# Patient Record
Sex: Female | Born: 1935 | Race: White | Hispanic: No
Health system: Southern US, Community
[De-identification: ages and names within clinical notes are randomized; demographics above are authoritative.]

---

## 2004-05-14 ENCOUNTER — Other Ambulatory Visit: Payer: Self-pay

## 2004-05-15 ENCOUNTER — Other Ambulatory Visit: Payer: Self-pay

## 2004-05-16 ENCOUNTER — Other Ambulatory Visit: Payer: Self-pay

## 2004-08-28 ENCOUNTER — Other Ambulatory Visit: Payer: Self-pay

## 2004-09-12 ENCOUNTER — Ambulatory Visit: Payer: Self-pay | Admitting: Pain Medicine

## 2004-09-21 ENCOUNTER — Ambulatory Visit: Payer: Self-pay | Admitting: Pain Medicine

## 2004-10-10 ENCOUNTER — Ambulatory Visit: Payer: Self-pay | Admitting: Pain Medicine

## 2004-10-20 ENCOUNTER — Ambulatory Visit: Payer: Self-pay | Admitting: Pain Medicine

## 2004-10-26 ENCOUNTER — Ambulatory Visit: Payer: Self-pay | Admitting: Pain Medicine

## 2004-12-01 ENCOUNTER — Ambulatory Visit: Payer: Self-pay | Admitting: Pain Medicine

## 2004-12-19 ENCOUNTER — Ambulatory Visit: Payer: Self-pay | Admitting: Pain Medicine

## 2005-01-09 ENCOUNTER — Encounter: Payer: Self-pay | Admitting: Pain Medicine

## 2005-01-16 ENCOUNTER — Encounter: Payer: Self-pay | Admitting: Pain Medicine

## 2005-01-19 ENCOUNTER — Ambulatory Visit: Payer: Self-pay | Admitting: Pain Medicine

## 2005-01-23 ENCOUNTER — Ambulatory Visit: Payer: Self-pay | Admitting: Pain Medicine

## 2005-01-25 ENCOUNTER — Ambulatory Visit: Payer: Self-pay | Admitting: Pain Medicine

## 2005-02-21 ENCOUNTER — Ambulatory Visit: Payer: Self-pay | Admitting: Pain Medicine

## 2005-03-01 ENCOUNTER — Ambulatory Visit: Payer: Self-pay | Admitting: Pain Medicine

## 2005-04-04 ENCOUNTER — Ambulatory Visit: Payer: Self-pay | Admitting: Pain Medicine

## 2005-04-10 ENCOUNTER — Ambulatory Visit: Payer: Self-pay | Admitting: Pain Medicine

## 2005-05-23 ENCOUNTER — Ambulatory Visit: Payer: Self-pay | Admitting: Pain Medicine

## 2005-06-22 ENCOUNTER — Ambulatory Visit: Payer: Self-pay | Admitting: Pain Medicine

## 2005-07-12 ENCOUNTER — Ambulatory Visit: Payer: Self-pay | Admitting: Pain Medicine

## 2005-09-26 ENCOUNTER — Ambulatory Visit: Payer: Self-pay | Admitting: Pain Medicine

## 2005-10-02 ENCOUNTER — Ambulatory Visit: Payer: Self-pay | Admitting: Pain Medicine

## 2005-11-09 ENCOUNTER — Ambulatory Visit: Payer: Self-pay | Admitting: Pain Medicine

## 2005-11-20 ENCOUNTER — Ambulatory Visit: Payer: Self-pay | Admitting: Pain Medicine

## 2007-02-27 ENCOUNTER — Ambulatory Visit: Payer: Self-pay | Admitting: Ophthalmology

## 2007-03-06 ENCOUNTER — Ambulatory Visit: Payer: Self-pay | Admitting: Ophthalmology

## 2007-04-11 ENCOUNTER — Ambulatory Visit: Payer: Self-pay | Admitting: Ophthalmology

## 2007-04-17 ENCOUNTER — Ambulatory Visit: Payer: Self-pay | Admitting: Ophthalmology

## 2007-10-22 ENCOUNTER — Other Ambulatory Visit: Payer: Self-pay

## 2007-10-23 ENCOUNTER — Observation Stay: Payer: Self-pay | Admitting: Internal Medicine

## 2007-11-13 ENCOUNTER — Emergency Department: Payer: Self-pay | Admitting: Internal Medicine

## 2007-11-14 ENCOUNTER — Observation Stay: Payer: Self-pay | Admitting: Internal Medicine

## 2007-11-14 ENCOUNTER — Other Ambulatory Visit: Payer: Self-pay

## 2008-04-06 ENCOUNTER — Ambulatory Visit: Payer: Self-pay | Admitting: Unknown Physician Specialty

## 2008-04-21 ENCOUNTER — Ambulatory Visit: Payer: Self-pay | Admitting: Internal Medicine

## 2010-12-09 ENCOUNTER — Inpatient Hospital Stay: Payer: Self-pay | Admitting: Internal Medicine

## 2011-03-15 ENCOUNTER — Ambulatory Visit: Payer: Self-pay | Admitting: Urology

## 2011-08-16 ENCOUNTER — Inpatient Hospital Stay: Payer: Self-pay | Admitting: Internal Medicine

## 2011-08-31 ENCOUNTER — Emergency Department: Payer: Self-pay | Admitting: *Deleted

## 2011-09-01 ENCOUNTER — Ambulatory Visit: Payer: Self-pay | Admitting: Unknown Physician Specialty

## 2011-10-30 ENCOUNTER — Ambulatory Visit: Payer: Self-pay | Admitting: Pain Medicine

## 2011-11-03 ENCOUNTER — Ambulatory Visit: Payer: Self-pay | Admitting: Pain Medicine

## 2011-11-06 ENCOUNTER — Ambulatory Visit: Payer: Self-pay | Admitting: Pain Medicine

## 2011-11-22 ENCOUNTER — Ambulatory Visit: Payer: Self-pay | Admitting: Pain Medicine

## 2012-11-16 ENCOUNTER — Emergency Department: Payer: Self-pay | Admitting: Unknown Physician Specialty

## 2012-11-16 LAB — URINALYSIS, COMPLETE
Bilirubin,UR: NEGATIVE
Glucose,UR: NEGATIVE mg/dL (ref 0–75)
Nitrite: NEGATIVE
Protein: 30
RBC,UR: 24 /HPF (ref 0–5)
Specific Gravity: 1.015 (ref 1.003–1.030)
Squamous Epithelial: 3
Transitional Epi: 1
WBC UR: 2201 /HPF (ref 0–5)

## 2012-11-16 LAB — COMPREHENSIVE METABOLIC PANEL
Albumin: 3.8 g/dL (ref 3.4–5.0)
BUN: 16 mg/dL (ref 7–18)
Calcium, Total: 8.7 mg/dL (ref 8.5–10.1)
Chloride: 108 mmol/L — ABNORMAL HIGH (ref 98–107)
Co2: 23 mmol/L (ref 21–32)
Creatinine: 0.77 mg/dL (ref 0.60–1.30)
EGFR (African American): >60
EGFR (Non-African Amer.): >60
Osmolality: 280 (ref 275–301)
Potassium: 4.1 mmol/L (ref 3.5–5.1)
SGOT(AST): 22 U/L (ref 15–37)
SGPT (ALT): 21 U/L (ref 12–78)
Sodium: 140 mmol/L (ref 136–145)

## 2012-11-16 LAB — CBC
HGB: 11.8 g/dL — ABNORMAL LOW (ref 12.0–16.0)
Platelet: 252 10*3/uL (ref 150–440)
RBC: 3.79 10*6/uL — ABNORMAL LOW (ref 3.80–5.20)
WBC: 9.3 10*3/uL (ref 3.6–11.0)

## 2012-11-17 LAB — URINE CULTURE

## 2012-11-18 ENCOUNTER — Emergency Department: Payer: Self-pay | Admitting: Emergency Medicine

## 2012-11-19 ENCOUNTER — Emergency Department: Payer: Self-pay | Admitting: Emergency Medicine

## 2012-11-19 LAB — BASIC METABOLIC PANEL
Anion Gap: 7 (ref 7–16)
BUN: 16 mg/dL (ref 7–18)
Calcium, Total: 9 mg/dL (ref 8.5–10.1)
EGFR (African American): >60
EGFR (Non-African Amer.): 59 — ABNORMAL LOW
Glucose: 94 mg/dL (ref 65–99)
Osmolality: 284 (ref 275–301)
Sodium: 142 mmol/L (ref 136–145)

## 2012-11-19 LAB — CBC
HGB: 12 g/dL (ref 12.0–16.0)
MCH: 32 pg (ref 26.0–34.0)
MCHC: 32.8 g/dL (ref 32.0–36.0)
MCV: 98 fL (ref 80–100)
Platelet: 220 10*3/uL (ref 150–440)
RDW: 14.4 % (ref 11.5–14.5)
WBC: 15.8 10*3/uL — ABNORMAL HIGH (ref 3.6–11.0)

## 2012-11-19 LAB — URINALYSIS, COMPLETE
Bilirubin,UR: NEGATIVE
Ketone: NEGATIVE
Nitrite: NEGATIVE
RBC,UR: 4 /HPF (ref 0–5)
Squamous Epithelial: 1
Transitional Epi: <1
WBC UR: 158 /HPF (ref 0–5)

## 2012-11-20 LAB — URINE CULTURE

## 2013-04-27 ENCOUNTER — Emergency Department: Payer: Self-pay | Admitting: Emergency Medicine

## 2013-04-27 LAB — CBC
HCT: 36.5 % (ref 35.0–47.0)
HGB: 12 g/dL (ref 12.0–16.0)
MCH: 31.7 pg (ref 26.0–34.0)
MCHC: 33 g/dL (ref 32.0–36.0)
MCV: 96 fL (ref 80–100)
Platelet: 183 10*3/uL (ref 150–440)
RBC: 3.8 10*6/uL (ref 3.80–5.20)
RDW: 14.3 % (ref 11.5–14.5)
WBC: 9.1 10*3/uL (ref 3.6–11.0)

## 2013-04-27 LAB — COMPREHENSIVE METABOLIC PANEL
Anion Gap: 7 (ref 7–16)
Chloride: 110 mmol/L — ABNORMAL HIGH (ref 98–107)
Co2: 25 mmol/L (ref 21–32)
EGFR (African American): >60
Glucose: 105 mg/dL — ABNORMAL HIGH (ref 65–99)
Osmolality: 285 (ref 275–301)
Potassium: 3.7 mmol/L (ref 3.5–5.1)
SGOT(AST): 25 U/L (ref 15–37)

## 2013-04-27 LAB — TROPONIN I: Troponin-I: <0.02 ng/mL

## 2013-09-15 LAB — BASIC METABOLIC PANEL
Anion Gap: 3 — ABNORMAL LOW (ref 7–16)
BUN: 19 mg/dL — ABNORMAL HIGH (ref 7–18)
Chloride: 109 mmol/L — ABNORMAL HIGH (ref 98–107)
Creatinine: 1.1 mg/dL (ref 0.60–1.30)
EGFR (Non-African Amer.): 43 — ABNORMAL LOW
Glucose: 101 mg/dL — ABNORMAL HIGH (ref 65–99)
Osmolality: 286 (ref 275–301)
Potassium: 4.2 mmol/L (ref 3.5–5.1)
Sodium: 142 mmol/L (ref 136–145)

## 2013-09-15 LAB — CBC
HCT: 38.6 % (ref 35.0–47.0)
HGB: 12.8 g/dL (ref 12.0–16.0)
MCH: 31.9 pg (ref 26.0–34.0)
MCHC: 33 g/dL (ref 32.0–36.0)

## 2013-09-15 LAB — URINALYSIS, COMPLETE
Bilirubin,UR: NEGATIVE
Glucose,UR: NEGATIVE mg/dL (ref 0–75)
Ketone: NEGATIVE
Ph: 5 (ref 4.5–8.0)
Protein: NEGATIVE
WBC UR: 38 /HPF (ref 0–5)

## 2013-09-15 LAB — TROPONIN I: Troponin-I: 0.06 ng/mL — ABNORMAL HIGH

## 2013-09-15 LAB — CK TOTAL AND CKMB (NOT AT ARMC): CK, Total: 108 U/L (ref 21–215)

## 2013-09-16 ENCOUNTER — Inpatient Hospital Stay: Payer: Self-pay | Admitting: Internal Medicine

## 2013-09-16 LAB — BASIC METABOLIC PANEL
Anion Gap: 5 — ABNORMAL LOW (ref 7–16)
Chloride: 111 mmol/L — ABNORMAL HIGH (ref 98–107)
Co2: 28 mmol/L (ref 21–32)
EGFR (African American): 54 — ABNORMAL LOW
EGFR (Non-African Amer.): 47 — ABNORMAL LOW
Glucose: 89 mg/dL (ref 65–99)

## 2013-09-16 LAB — TROPONIN I: Troponin-I: 0.19 ng/mL — ABNORMAL HIGH

## 2013-09-17 LAB — URINE CULTURE

## 2013-12-11 ENCOUNTER — Ambulatory Visit: Payer: Self-pay | Admitting: Internal Medicine

## 2013-12-16 LAB — COMPREHENSIVE METABOLIC PANEL
ALBUMIN: 3.4 g/dL (ref 3.4–5.0)
Alkaline Phosphatase: 82 U/L
Anion Gap: 5 — ABNORMAL LOW (ref 7–16)
BUN: 16 mg/dL (ref 7–18)
Bilirubin,Total: 0.4 mg/dL (ref 0.2–1.0)
CO2: 26 mmol/L (ref 21–32)
CREATININE: 0.85 mg/dL (ref 0.60–1.30)
Calcium, Total: 8.6 mg/dL (ref 8.5–10.1)
Chloride: 103 mmol/L (ref 98–107)
EGFR (African American): >60
EGFR (Non-African Amer.): 58 — ABNORMAL LOW
Glucose: 121 mg/dL — ABNORMAL HIGH (ref 65–99)
Osmolality: 271 (ref 275–301)
Potassium: 3.7 mmol/L (ref 3.5–5.1)
SGOT(AST): 22 U/L (ref 15–37)
SGPT (ALT): 14 U/L (ref 12–78)
Sodium: 134 mmol/L — ABNORMAL LOW (ref 136–145)
TOTAL PROTEIN: 6.9 g/dL (ref 6.4–8.2)

## 2013-12-16 LAB — CBC WITH DIFFERENTIAL/PLATELET
BASOS PCT: 0.4 %
Basophil #: 0 10*3/uL (ref 0.0–0.1)
EOS PCT: 4.2 %
Eosinophil #: 0.5 10*3/uL (ref 0.0–0.7)
HCT: 39.4 % (ref 35.0–47.0)
HGB: 12.6 g/dL (ref 12.0–16.0)
LYMPHS ABS: 5.5 10*3/uL — AB (ref 1.0–3.6)
Lymphocyte %: 50.2 %
MCH: 29.5 pg (ref 26.0–34.0)
MCHC: 32 g/dL (ref 32.0–36.0)
MCV: 92 fL (ref 80–100)
Monocyte #: 1 x10 3/mm — ABNORMAL HIGH (ref 0.2–0.9)
Monocyte %: 9.3 %
NEUTROS PCT: 35.9 %
Neutrophil #: 3.9 10*3/uL (ref 1.4–6.5)
Platelet: 245 10*3/uL (ref 150–440)
RBC: 4.27 10*6/uL (ref 3.80–5.20)
RDW: 14.2 % (ref 11.5–14.5)
WBC: 11 10*3/uL (ref 3.6–11.0)

## 2013-12-16 LAB — URINALYSIS, COMPLETE
BILIRUBIN, UR: NEGATIVE
Bacteria: NONE SEEN
Blood: NEGATIVE
GLUCOSE, UR: NEGATIVE mg/dL (ref 0–75)
Ketone: NEGATIVE
Leukocyte Esterase: NEGATIVE
Nitrite: NEGATIVE
PH: 6 (ref 4.5–8.0)
PROTEIN: NEGATIVE
Specific Gravity: 1.005 (ref 1.003–1.030)
WBC UR: <1 /HPF (ref 0–5)

## 2013-12-17 ENCOUNTER — Inpatient Hospital Stay: Payer: Self-pay | Admitting: Internal Medicine

## 2013-12-17 LAB — CBC WITH DIFFERENTIAL/PLATELET
Basophil: 1 %
Comment - H1-Com1: NORMAL
Comment - H1-Com2: NORMAL
EOS PCT: 4 %
HCT: 37.1 % (ref 35.0–47.0)
HGB: 12 g/dL (ref 12.0–16.0)
LYMPHS PCT: 59 %
MCH: 30.1 pg (ref 26.0–34.0)
MCHC: 32.5 g/dL (ref 32.0–36.0)
MCV: 93 fL (ref 80–100)
Monocytes: 6 %
Platelet: 209 10*3/uL (ref 150–440)
RBC: 4 10*6/uL (ref 3.80–5.20)
RDW: 14 % (ref 11.5–14.5)
Segmented Neutrophils: 28 %
Variant Lymphocyte - H1-Rlymph: 2 %
WBC: 8.5 10*3/uL (ref 3.6–11.0)

## 2013-12-17 LAB — BASIC METABOLIC PANEL
Anion Gap: 4 — ABNORMAL LOW (ref 7–16)
BUN: 12 mg/dL (ref 7–18)
CHLORIDE: 108 mmol/L — AB (ref 98–107)
CREATININE: 0.77 mg/dL (ref 0.60–1.30)
Calcium, Total: 8.5 mg/dL (ref 8.5–10.1)
Co2: 28 mmol/L (ref 21–32)
Glucose: 89 mg/dL (ref 65–99)
Osmolality: 279 (ref 275–301)
Potassium: 3.9 mmol/L (ref 3.5–5.1)
SODIUM: 140 mmol/L (ref 136–145)

## 2013-12-27 ENCOUNTER — Inpatient Hospital Stay: Payer: Self-pay | Admitting: Specialist

## 2013-12-27 LAB — POTASSIUM: Potassium: 4.3 mmol/L (ref 3.5–5.1)

## 2013-12-27 LAB — URINALYSIS, COMPLETE
BACTERIA: NONE SEEN
Bilirubin,UR: NEGATIVE
Blood: NEGATIVE
Glucose,UR: NEGATIVE mg/dL (ref 0–75)
Ketone: NEGATIVE
LEUKOCYTE ESTERASE: NEGATIVE
NITRITE: NEGATIVE
PH: 5 (ref 4.5–8.0)
Protein: NEGATIVE
RBC,UR: 1 /HPF (ref 0–5)
Specific Gravity: 1.017 (ref 1.003–1.030)
Squamous Epithelial: 15

## 2013-12-27 LAB — COMPREHENSIVE METABOLIC PANEL
ALK PHOS: 73 U/L
ANION GAP: 9 (ref 7–16)
Albumin: 3.3 g/dL — ABNORMAL LOW (ref 3.4–5.0)
BUN: 63 mg/dL — ABNORMAL HIGH (ref 7–18)
Bilirubin,Total: 0.5 mg/dL (ref 0.2–1.0)
CALCIUM: 8.1 mg/dL — AB (ref 8.5–10.1)
Chloride: 101 mmol/L (ref 98–107)
Co2: 20 mmol/L — ABNORMAL LOW (ref 21–32)
Creatinine: 1 mg/dL (ref 0.60–1.30)
EGFR (African American): 55 — ABNORMAL LOW
EGFR (Non-African Amer.): 48 — ABNORMAL LOW
Glucose: 86 mg/dL (ref 65–99)
OSMOLALITY: 278 (ref 275–301)
Potassium: 5.9 mmol/L — ABNORMAL HIGH (ref 3.5–5.1)
SGOT(AST): 28 U/L (ref 15–37)
SGPT (ALT): 16 U/L (ref 12–78)
Sodium: 130 mmol/L — ABNORMAL LOW (ref 136–145)
Total Protein: 6.3 g/dL — ABNORMAL LOW (ref 6.4–8.2)

## 2013-12-27 LAB — CBC WITH DIFFERENTIAL/PLATELET
BASOS PCT: 0.3 %
Basophil #: 0.1 10*3/uL (ref 0.0–0.1)
EOS ABS: 0.2 10*3/uL (ref 0.0–0.7)
EOS PCT: 0.9 %
HCT: 39.8 % (ref 35.0–47.0)
HGB: 13 g/dL (ref 12.0–16.0)
LYMPHS ABS: 9.7 10*3/uL — AB (ref 1.0–3.6)
Lymphocyte %: 51.4 %
MCH: 30.3 pg (ref 26.0–34.0)
MCHC: 32.6 g/dL (ref 32.0–36.0)
MCV: 93 fL (ref 80–100)
MONO ABS: 1.6 x10 3/mm — AB (ref 0.2–0.9)
Monocyte %: 8.4 %
NEUTROS PCT: 39 %
Neutrophil #: 7.4 10*3/uL — ABNORMAL HIGH (ref 1.4–6.5)
Platelet: 286 10*3/uL (ref 150–440)
RBC: 4.28 10*6/uL (ref 3.80–5.20)
RDW: 14.8 % — ABNORMAL HIGH (ref 11.5–14.5)
WBC: 18.9 10*3/uL — ABNORMAL HIGH (ref 3.6–11.0)

## 2013-12-27 LAB — MAGNESIUM: MAGNESIUM: 2.8 mg/dL — AB

## 2013-12-27 LAB — LIPASE, BLOOD: Lipase: 164 U/L (ref 73–393)

## 2013-12-27 LAB — TROPONIN I: Troponin-I: 0.05 ng/mL

## 2013-12-28 LAB — BASIC METABOLIC PANEL
ANION GAP: 8 (ref 7–16)
BUN: 39 mg/dL — ABNORMAL HIGH (ref 7–18)
CALCIUM: 7.8 mg/dL — AB (ref 8.5–10.1)
CHLORIDE: 112 mmol/L — AB (ref 98–107)
CREATININE: 0.91 mg/dL (ref 0.60–1.30)
Co2: 23 mmol/L (ref 21–32)
EGFR (African American): >60
GFR CALC NON AF AMER: 54 — AB
Glucose: 72 mg/dL (ref 65–99)
OSMOLALITY: 293 (ref 275–301)
Potassium: 3.8 mmol/L (ref 3.5–5.1)
Sodium: 143 mmol/L (ref 136–145)

## 2013-12-28 LAB — CBC WITH DIFFERENTIAL/PLATELET
BASOS ABS: 0 10*3/uL (ref 0.0–0.1)
BASOS PCT: 0.3 %
Eosinophil #: 0.2 10*3/uL (ref 0.0–0.7)
Eosinophil %: 1.6 %
HCT: 34.3 % — ABNORMAL LOW (ref 35.0–47.0)
HGB: 10.9 g/dL — ABNORMAL LOW (ref 12.0–16.0)
Lymphocyte #: 4.7 10*3/uL — ABNORMAL HIGH (ref 1.0–3.6)
Lymphocyte %: 48.6 %
MCH: 29.7 pg (ref 26.0–34.0)
MCHC: 31.9 g/dL — ABNORMAL LOW (ref 32.0–36.0)
MCV: 93 fL (ref 80–100)
MONO ABS: 1 x10 3/mm — AB (ref 0.2–0.9)
Monocyte %: 10.3 %
NEUTROS PCT: 39.2 %
Neutrophil #: 3.8 10*3/uL (ref 1.4–6.5)
Platelet: 211 10*3/uL (ref 150–440)
RBC: 3.67 10*6/uL — ABNORMAL LOW (ref 3.80–5.20)
RDW: 14.5 % (ref 11.5–14.5)
WBC: 9.6 10*3/uL (ref 3.6–11.0)

## 2013-12-28 LAB — MAGNESIUM: MAGNESIUM: 2.4 mg/dL

## 2013-12-28 LAB — T4, FREE: FREE THYROXINE: 1.28 ng/dL (ref 0.76–1.46)

## 2013-12-28 LAB — TSH: THYROID STIMULATING HORM: 0.41 u[IU]/mL — AB

## 2013-12-29 LAB — HEMOGLOBIN: HGB: 10.4 g/dL — AB (ref 12.0–16.0)

## 2014-01-01 LAB — BASIC METABOLIC PANEL
ANION GAP: 6 — AB (ref 7–16)
BUN: 13 mg/dL (ref 7–18)
Calcium, Total: 8.4 mg/dL — ABNORMAL LOW (ref 8.5–10.1)
Chloride: 109 mmol/L — ABNORMAL HIGH (ref 98–107)
Co2: 25 mmol/L (ref 21–32)
Creatinine: 0.9 mg/dL (ref 0.60–1.30)
EGFR (African American): >60
EGFR (Non-African Amer.): 54 — ABNORMAL LOW
Glucose: 92 mg/dL (ref 65–99)
Osmolality: 279 (ref 275–301)
Potassium: 3.7 mmol/L (ref 3.5–5.1)
Sodium: 140 mmol/L (ref 136–145)

## 2014-01-01 LAB — PLATELET COUNT: Platelet: 200 10*3/uL (ref 150–440)

## 2014-01-01 LAB — HEMOGLOBIN: HGB: 11 g/dL — ABNORMAL LOW (ref 12.0–16.0)

## 2014-01-11 ENCOUNTER — Ambulatory Visit: Payer: Self-pay | Admitting: Nurse Practitioner

## 2014-02-08 DEATH — deceased

## 2015-01-25 IMAGING — CT CT ABD-PELV W/ CM
2 of 5 series · 15 of 46 positions shown, 17 images · IV contrast (agent unspecified)
Comparison: MRI lumbar spine 11/03/2011.

CLINICAL DATA: Vomiting for 3 days.

EXAM:
CT ABDOMEN AND PELVIS WITH CONTRAST
TECHNIQUE: Multidetector CT imaging of the abdomen and pelvis was performed
using the standard protocol following bolus administration of
intravenous contrast.
CONTRAST:  100 mL [id number]

[Series 2: routine abd pel with · axial · 0.64mm/px · z∈[-412,-32]mm · 12 of 84 slices shown, 14 images]
[im 4/84  soft-tissue]
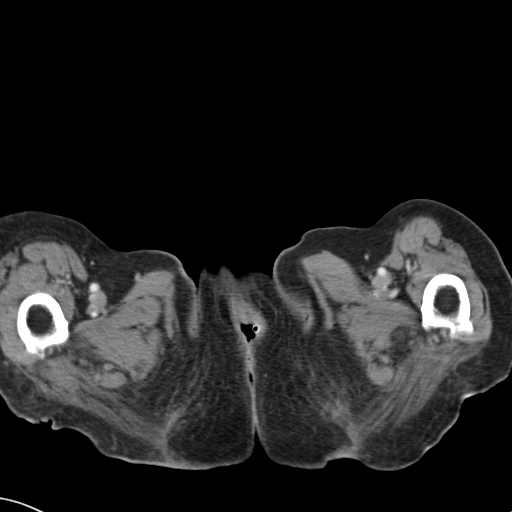
[im 4/84  bone]
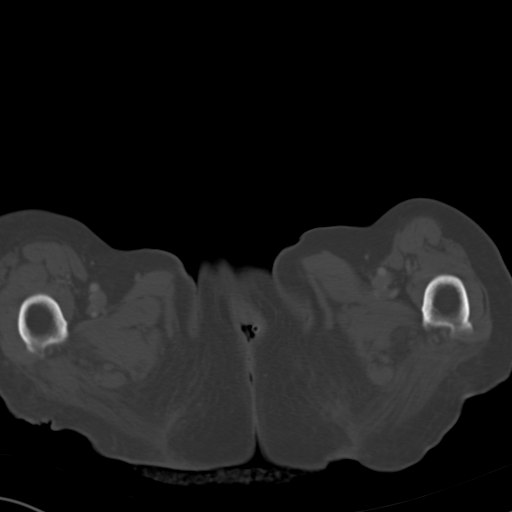
[im 12/84  soft-tissue]
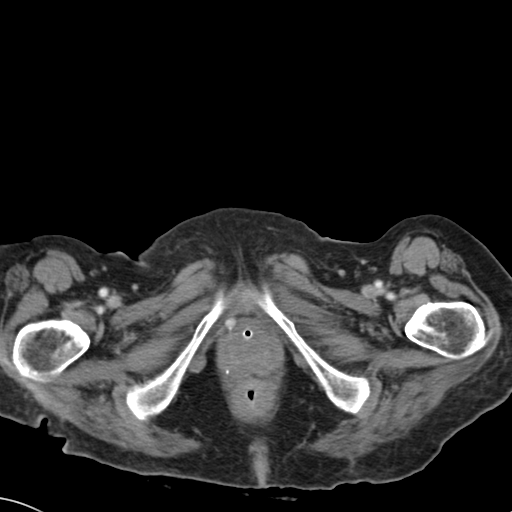
[im 20/84  soft-tissue]
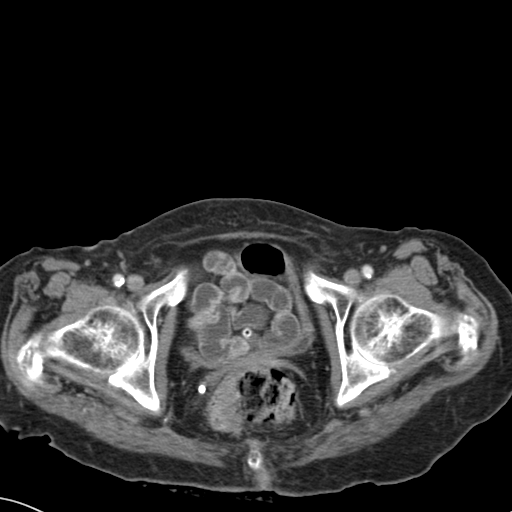
[im 24/84  soft-tissue]
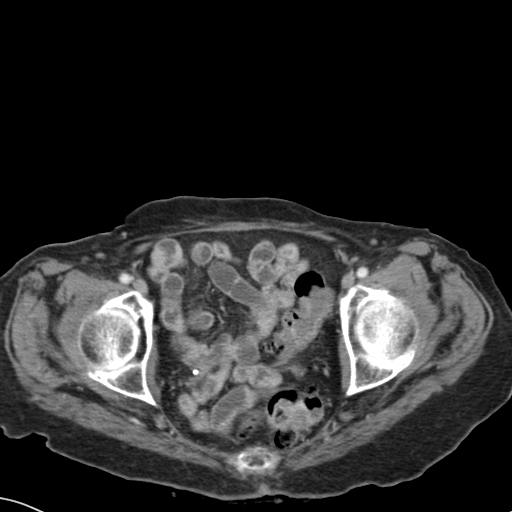
[im 32/84  soft-tissue]
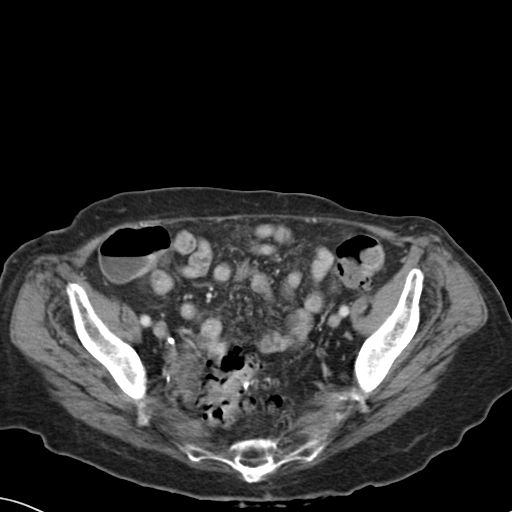
[im 40/84  soft-tissue]
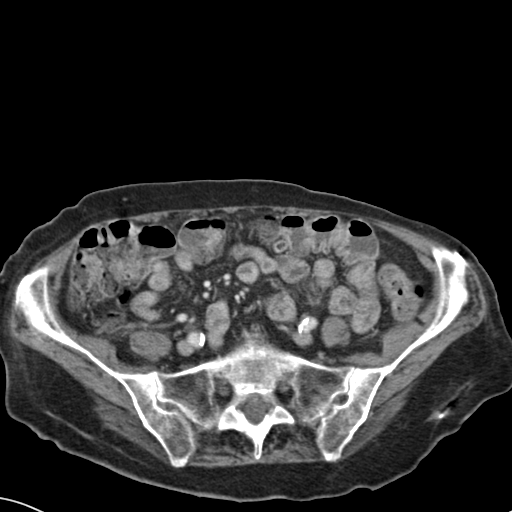
[im 44/84  soft-tissue]
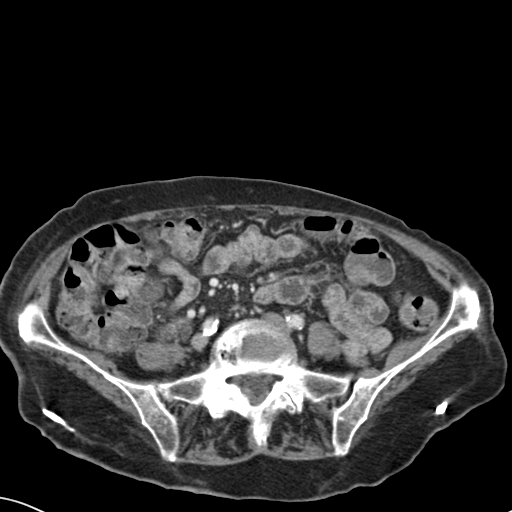
[im 52/84  soft-tissue]
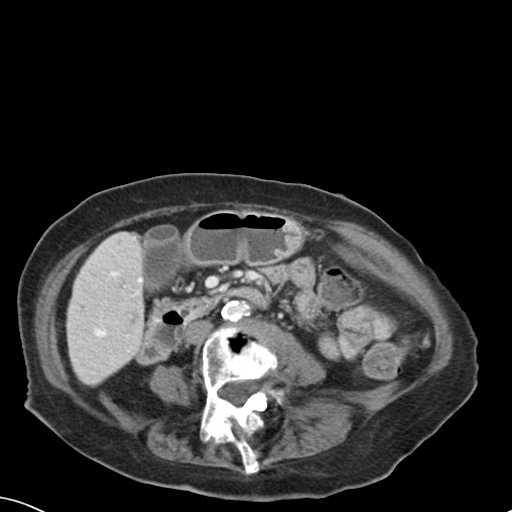
[im 60/84  soft-tissue]
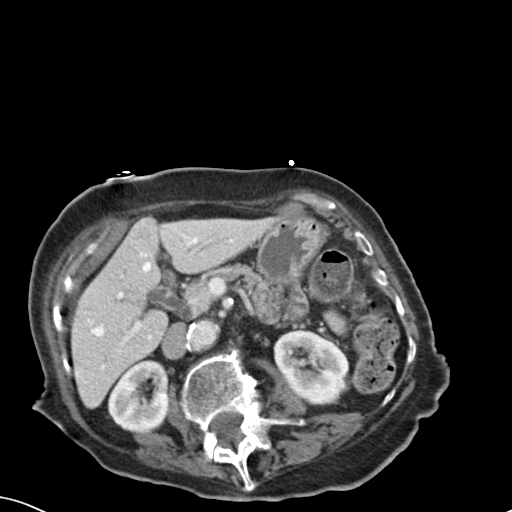
[im 60/84  bone]
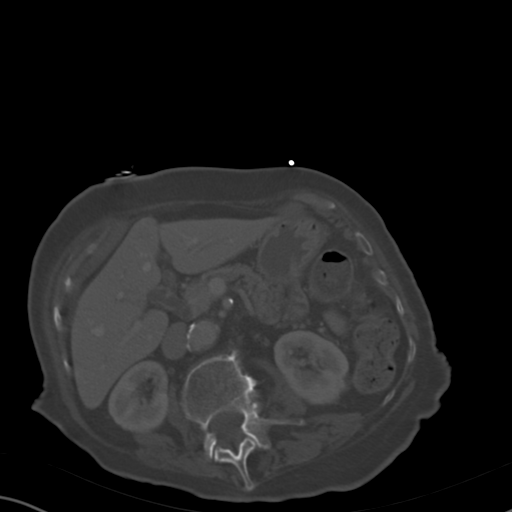
[im 64/84  soft-tissue]
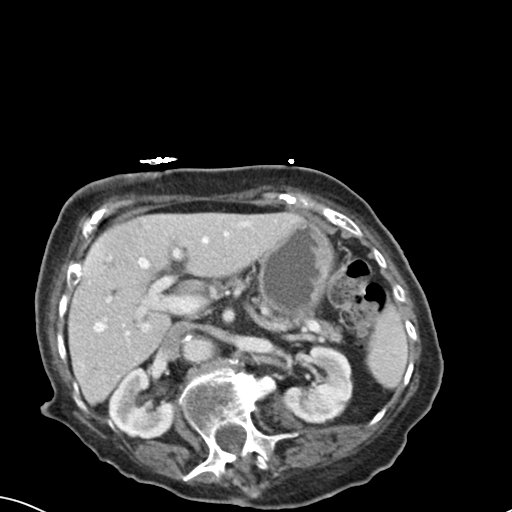
[im 72/84  soft-tissue]
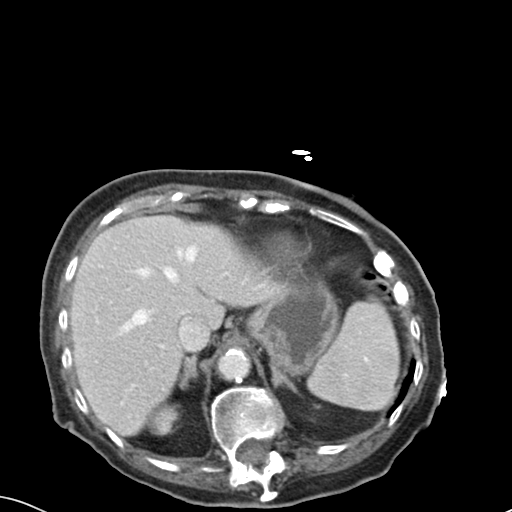
[im 80/84  soft-tissue]
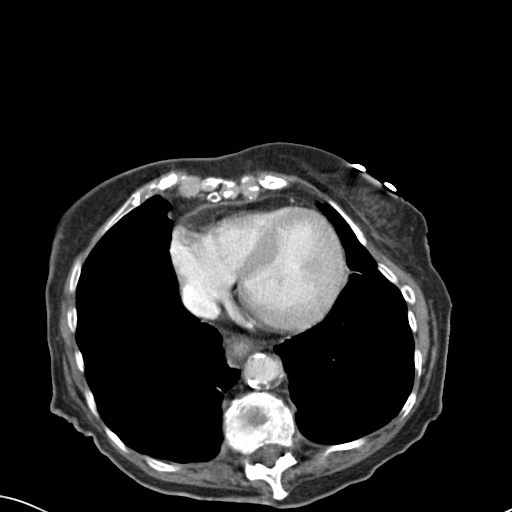

[Series 6: cor routine abd pel with · coronal · 0.62mm/px · 3 of 123 slices shown]
[im 41/123  soft-tissue]
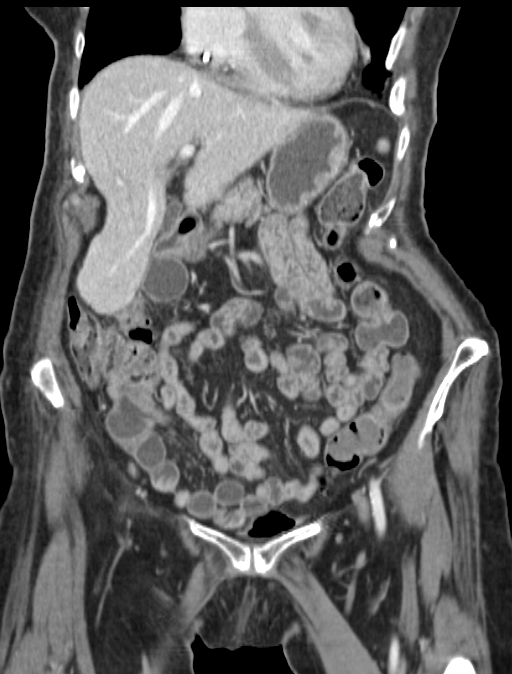
[im 55/123  soft-tissue]
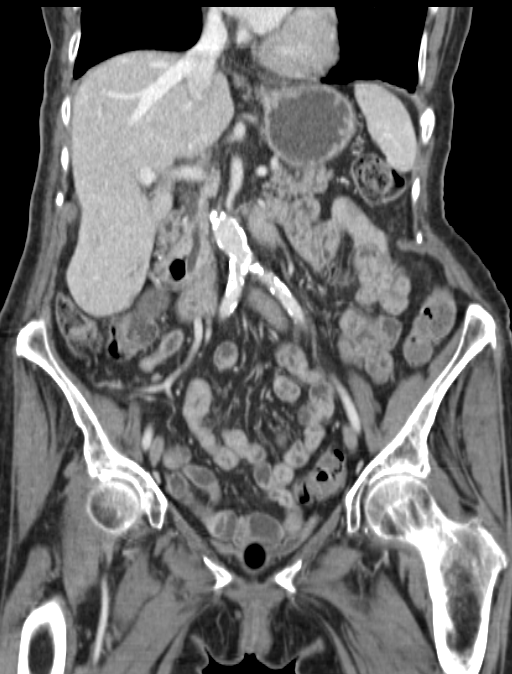
[im 68/123  soft-tissue]
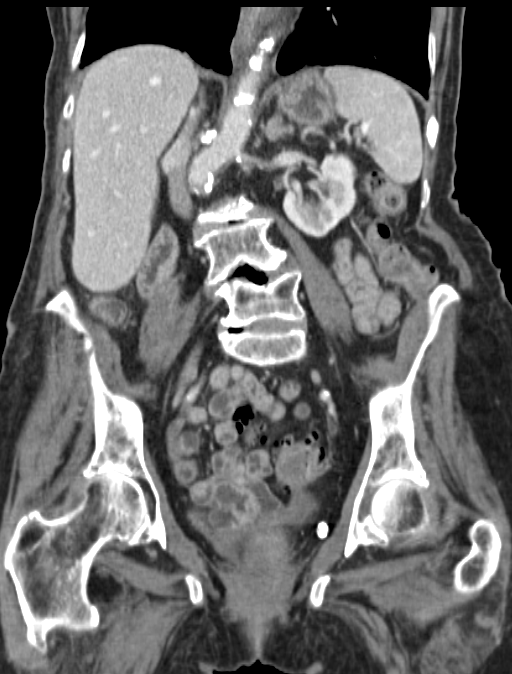

[15 of 46 positions shown; findings below may reference images not displayed]

FINDINGS: The lung bases are clear without focal nodule, mass, or airspace
disease. The heart size is normal. Right coronary artery
calcifications are present.

The liver demonstrates minimal intra hepatic dilation, likely within
normal limits for age. The extrahepatic common duct is within normal
limits. No focal hepatic mass lesion is present. The spleen is
normal. The stomach and duodenum are normal. There is slight
dilation of the pancreatic duct within the head and body, likely
within normal limits for age. Mild generalized atrophy is present.
The adrenal glands are normal bilaterally. The kidneys and ureters
are unremarkable. The urinary bladder is collapsed with a Foley
catheter in place.

Atherosclerotic calcifications are present within the aorta and
branch vessels without aneurysm.

Diverticular changes are present within the rectosigmoid colon
without focal inflammation to suggest diverticulitis. The appendix
is not discretely visualized and may be surgically absent. No
significant adenopathy or free fluid is present. Patient is status
post hysterectomy. The ovaries are not clearly visualized and may be
surgically absent as well.

The bone windows demonstrate moderate rightward curvature of the
lumbar spine centered at L2-3. The T11 compression fracture is new
since 2822. It does not appear to be acute. No focal lytic or
blastic lesions are present.
IMPRESSION: 1. No acute or focal lesion to explain the patient's symptoms.
2. Moderate atherosclerotic disease without evidence for aneurysm.
3. Colonic diverticulosis without evidence for diverticulitis.
4. A Foley catheter is present within the urinary bladder.
5. Rightward scoliosis of the lumbar spine is centered at L2-3.
6. T11 compression fracture. This does not appear to be acute, but
is new since [DATE].

## 2015-01-28 IMAGING — CR DG CHEST 1V PORT
1 series · 1 of 1 positions shown · non-contrast
Comparison: DG CHEST 2V dated 12/27/2013; DG CHEST 1V PORT dated
12/17/2013; CT ABD-PELV W/ CM dated 12/27/2013

CLINICAL DATA: Chest pressure. Shortness of breath.

EXAM:
PORTABLE CHEST - 1 VIEW

[ap]
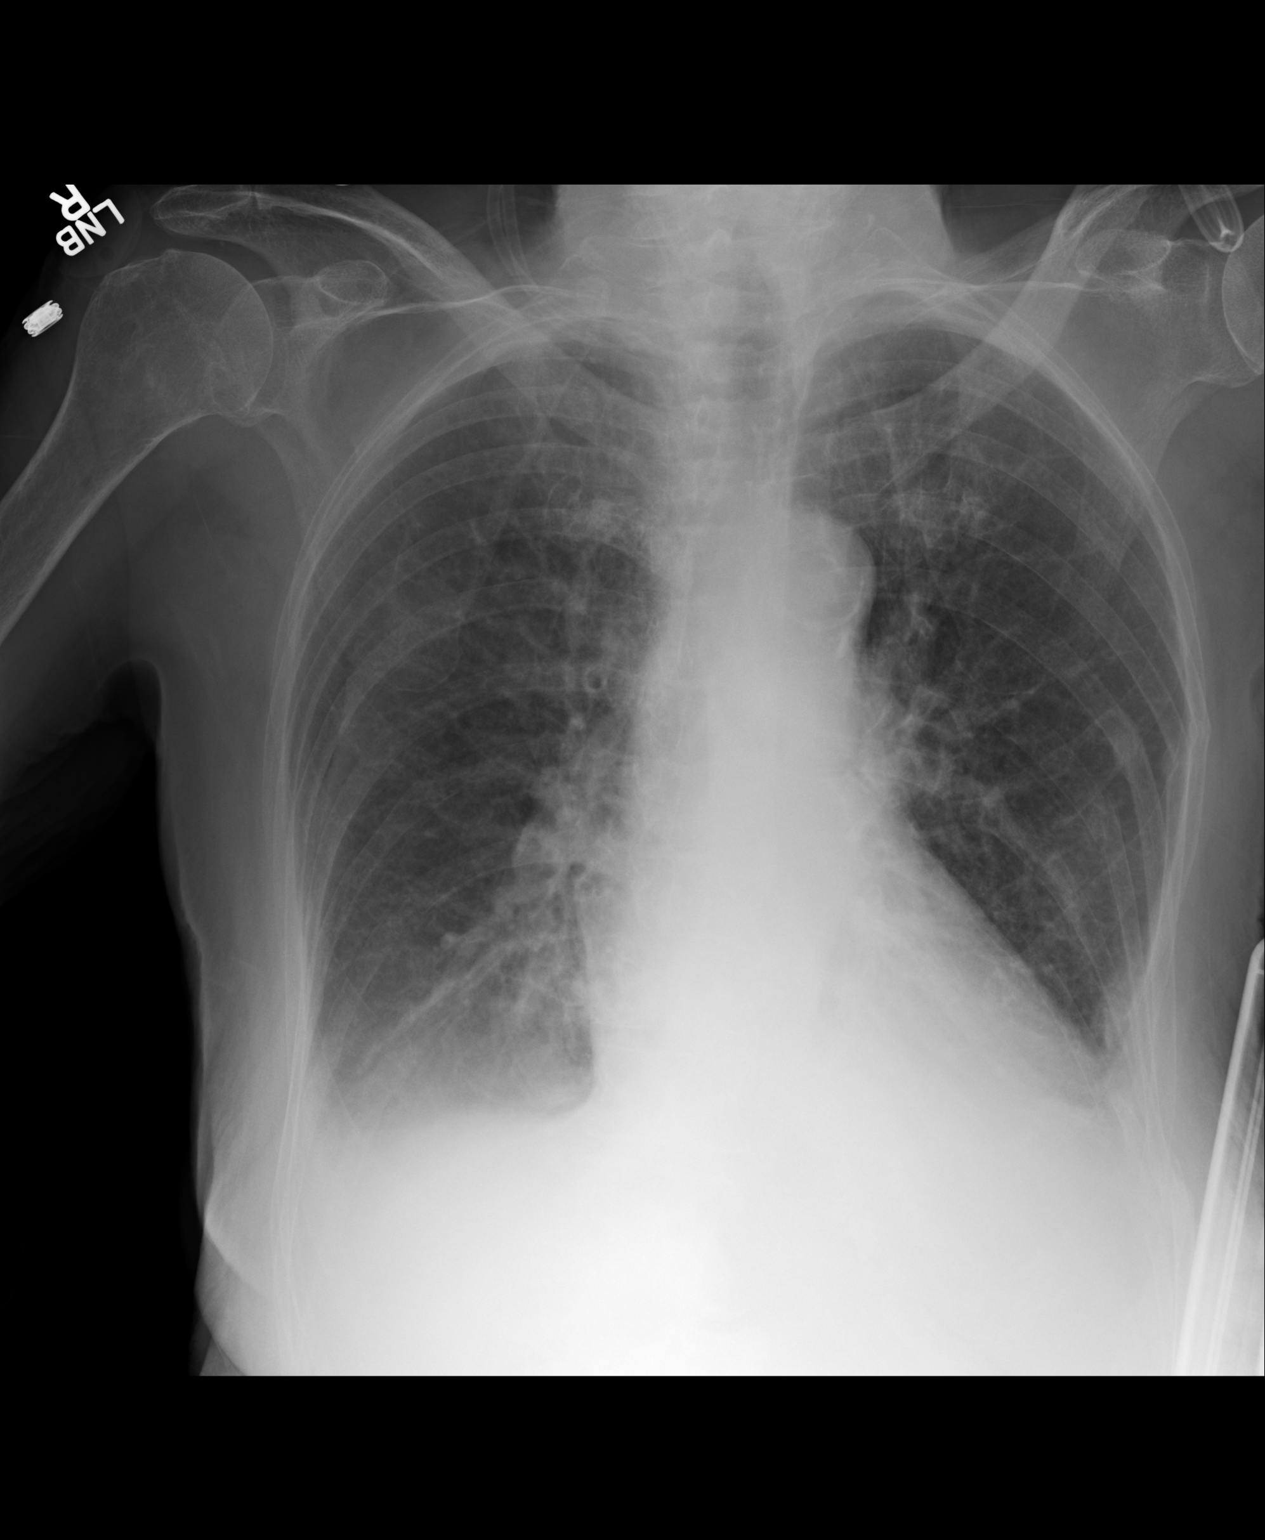

[1 of 1 positions shown; findings below may reference images not displayed]

FINDINGS: Prior left rib fractures. Airway thickening. Progressive obscure a
shin of the hemidiaphragms with blunted costophrenic angles.
Cardiothoracic index 61%, compatible with mild cardiomegaly.

Atherosclerotic calcification of the aortic arch. Upper zone
pulmonary vascular prominence.
IMPRESSION: 1. New blunting of the costophrenic angles and ill-defined opacities
at the lung bases, favoring layering pleural effusions with passive
atelectasis.
2. Airway thickening, query bronchitis or reactive airways disease.
3. Mild cardiomegaly with pulmonary venous hypertension.
4. Healing left rib fractures.

## 2015-04-02 NOTE — Discharge Summary (Signed)
PATIENT NAME:  Gabriella Rosales, Gabriella Rosales MR#:  [id number] DATE OF BIRTH:  1916/11/19  DATE OF ADMISSION:  09/16/2013 DATE OF DISCHARGE:  09/19/2013  ADMISSION DIAGNOSIS: Rib pain.   DISCHARGE DIAGNOSES:   1.  Rib pain, likely secondary to rib fracture.  2.  Extended-spectrum beta-lactamase urinary tract infection.  3.  Elevated troponin, likely secondary to supply-demand ischemia.  4.  History of coronary artery disease.  5.  Status post fall.   CONSULTATIONS: None.   The patient had a PICC line placed 09/18/2013.   PERTINENT LABORATORY DATA AT DISCHARGE: Troponin at discharge is 0.19, troponin max 0.37. Sodium 144, potassium 4.1, chloride 111, bicarbonate 28, BUN 18, creatinine 1.02. Glucose is 89.   X-ray of the ribs showed deformity of the lateral aspect of the left fifth and sixth rib, which could represent fracture of uncertain etiology.   Urine culture was positive for ESBL.   CT of the head showed no acute intracranial hemorrhage or CVA.   HOSPITAL COURSE: This is a 81 year old female status post a fall who subsequently had left rib pain. For further details, please refer to the H and P.  1.  Rib pain after a fall with possible fracture: Unclear if this is an acute fracture. The patient was on pain medications. I actually started some long-acting narcotics to help with her pain. She is now on MS Contin q.8 hours with p.r.n. p.o. breakthrough pain. She is also on Colace. She will continue her incentive spirometry and she will need rehab.  2.  Chronic cystitis with ESBL in urine: The patient has had chronic cystitis; however, she now has ESBL. This is being treated for with imipenem. She has a PICC line placed and needs 7 more days of antibiotics. 3.  History of CAD: The patient will continue her outpatient medications. 4.  Elevated troponin: Likely supply-demand ischemia from her fall.   DISCHARGE MEDICATIONS: 1.  MS Contin 15 mg p.o. t.i.d.  2.  Omeprazole 40 mg daily.  3.  Benazepril 20  mg daily.  4.  Acetaminophen/hydrocodone 650/10 1 tablet q.6 hours p.r.n. pain.  5.  Invanz 0.5 mg q.24 hours x 7 days.  6.  Lidocaine  5% topical film to affected area daily.  7.  Senna 1 tablet b.i.d. p.r.n.  8.  Colace 100 mg b.i.d.   DISCHARGE TREATMENT: Incentive spirometer every 2 to 3 hours while awake.   DISCHARGE ACTIVITY: As tolerated. We encourage ambulation.   DISCHARGE DIET: Regular diet.   DISCHARGE FOLLOWUP: The patient should follow up with Dr. Maryellen PileEason in 2 to 4 weeks.    TIME SPENT: Approximately 35 minutes. The patient is medically stable for discharge.  ____________________________ Lateef Juncaj P. Juliene PinaMody, MD spm:jm D: 09/19/2013 12:32:00 ET T: 09/19/2013 12:47:41 ET JOB#: [id number]  cc: Serita ShellerErnest B. Maryellen PileEason, MD Ashawna Hanback P. Juliene PinaMody, MD, <Dictator>  Janyth ContesSITAL P Corky Blumstein MD ELECTRONICALLY SIGNED 09/19/2013 14:13

## 2015-04-02 NOTE — Op Note (Signed)
PATIENT NAME:  Gabriella Rosales, [name] MR#:  [id number] DATE OF BIRTH:  [birth year]  DATE OF PROCEDURE:  09/18/2013   PREOPERATIVE DIAGNOSES:   1.  Extended-spectrum beta-lactamase urinary tract infection.  2.  Rib fractures.  3.  Coronary artery disease.   POSTOPERATIVE DIAGNOSES:  1.  Extended-spectrum beta-lactamase urinary tract infection.  2.  Rib fractures.  3.  Coronary artery disease.   PROCEDURES:  1. Ultrasound guidance for vascular access to right basilic vein.  2. Fluoroscopic guidance for placement of catheter.  3. Insertion of peripherally inserted central venous catheter, right arm.  SURGEON: Annice NeedyJason S. Alyrica Thurow, MD.  ANESTHESIA: Local.   ESTIMATED BLOOD LOSS: Minimal.   INDICATION FOR PROCEDURE:  A 81 year old white female with ESBL in the urine who needs extended IV antibiotics.   DESCRIPTION OF PROCEDURE: The patient's right arm was sterilely prepped and draped, and a sterile surgical field was created. The right basilic vein was accessed under direct ultrasound guidance without difficulty with a micropuncture needle and permanent image was recorded. 0.018 wire was then placed into the superior vena cava. Peel-away sheath was placed over the wire. A single lumen peripherally inserted central venous catheter was then placed over the wire and the wire and peel-away sheath were removed. The catheter tip was placed into the superior vena cava and was secured at the skin at 29 cm with a sterile dressing. The catheter withdrew blood well and flushed easily with heparinized saline. The patient tolerated procedure well.   ____________________________ Annice NeedyJason S. Nathaneal Sommers, MD jsd:np D: 09/18/2013 16:57:59 ET T: 09/18/2013 22:13:27 ET JOB#: [id number]  cc: Annice NeedyJason S. Alphus Zeck, MD, <Dictator> Annice NeedyJASON S Christan Defranco MD ELECTRONICALLY SIGNED 10/01/2013 10:23

## 2015-04-02 NOTE — H&P (Signed)
PATIENT NAME:  Gabriella Rosales, Gabriella Rosales MR#:  [id number] DATE OF BIRTH:  09-Apr-1916  DATE OF ADMISSION:  09/15/2013  PRIMARY CARE PHYSICIAN: Dr. Toy CookeyErnest Eason.  REFERRING EMERGENCY ROOM PHYSICIAN:  Dr. Margarita GrizzleWoodruff.  CHIEF COMPLAINT: Fall and pain.   HISTORY OF PRESENTING ILLNESS: A 81 year old female who lives alone, an independent life. Walks with a walker. Son lives nearby and takes care of her day-to-day requirements.  The patient is able to manage her day-to-day activities, walking from one room to the other and taking care of herself. Today, while she was walking from one room to the other, she suddenly fell down. She does not know what happened, possibly she says that "maybe  my walker wheel got stuck somewhere."  She hit her head and fell down on the floor, and during that, she started having left-sided chest pain after the fall. She never lost her consciousness, and on further questioning, denies any palpitations, fever or any dizziness. ER physician gave injection of morphine repeatedly for pain management, but still not able to control it very well, and so she being admitted for pain management.   REVIEW OF SYSTEMS:  CONSTITUTIONAL: Negative for fever, fatigue, weakness, but has severe pain on the left lower chest.  EYES: No blurring, double vision or discharge.  EARS, NOSE, THROAT: No tinnitus, ear pain or hearing loss.  RESPIRATORY: No cough, wheezing, hemoptysis or shortness of breath.  CARDIOVASCULAR: No orthopnea, edema, palpitations or syncopal episode.  GASTROINTESTINAL: No nausea, vomiting, diarrhea or abdominal pain.  GENITOURINARY: No dysuria, hematuria or increased frequency.  ENDOCRINE: No polyhydruria, increased sweating or heat or cold intolerance.  SKIN: No acne, rashes or lesions.  MUSCULOSKELETAL: Has pain in left lower chest on the side and excruciating pain on local palpation, but otherwise not any new joint pain or swelling.  NEUROLOGICAL: No numbness, weakness, tremors or  vertigo.  PSYCHIATRIC: No anxiety, insomnia, bipolar disorder.   PAST MEDICAL HISTORY:  1.  Arthritis, on chronic narcotics.  2.  Gastroesophageal reflux disease.  4.  History of dilation and stricture secondary 5.  Coronary artery disease.  6.  Hiatal hernia with reflux esophagitis.  7.  History of diverticular disease.  8.  Peptic ulcer disease and gastric ulcer disease.  9.  History of chronic urinary tract infections with cystitis on and off and tried multiple antibiotic courses, but as soon as she finished the course, the infection comes back, and she is not having any great trouble because of this urinary chronic infections.   ALLERGIES: SULFA DRUGS.   PAST SURGICAL HISTORY: Hysterectomy in 1965.   SOCIAL HISTORY: No tobacco. No alcohol or drug use.   FAMILY HISTORY: Positive for stroke in many family members.   MEDICATIONS:  1.  Omeprazole 40 mg once a day.  2.  Benazepril 20 mg oral once a day.  3.  Acetaminophen and hydrocodone 650 mg plus 10 mg oral tablet every 6 hours as needed for pain.   PHYSICAL EXAMINATION: VITAL SIGNS: In ER, temperature 97.8, pulse 92, respirations 18 and blood pressure was 192/81, which came down to 162/73 after pain management, and oxygen saturation 97 on room air.  GENERAL: The patient is fully alert and oriented to time, place and person and does not appear in any acute distress. She has pain in the left chest, but she is able to control it if she remains still in the bed without any movement.  HEENT: Head and neck atraumatic. Conjunctivae pink. Oral mucosa moist.  NECK: Supple.  No JVD.  RESPIRATORY: Bilateral clear and equal air entry.  CARDIOVASCULAR: S1, S2 present, regular. No murmur. There is pain on the left side of the chest on the lower ribs on local palpation.  ABDOMEN: Soft, nontender. Bowel sounds present. No organomegaly.  SKIN: No rashes.  EXTREMITIES:  Legs: No edema.  NEUROLOGICAL: Power 4/5. Follows commands. Moves all 4  limbs, but movement is restricted overall due to pain in the left chest with any kind of movement.   IMPORTANT LABORATORY RESULTS: Glucose 101, BUN 19, creatinine 1.1, sodium 142, potassium 4.2, chloride 109, CO2 of 30, calcium 9.1. Troponin 0.06. WBC 14.6, hemoglobin 12.8, platelet count 193, and MCV 97. Urinalysis is positive with 38 WBCs, 16 RBCs with 2+ leukocyte esterase. CT of the head is negative. Chest x-ray, PA and lateral, is negative for any abnormalities.   ASSESSMENT AND PLAN:  79.  A 81 year old female, fully independent in day-to-day activities and living alone, had a fall accidental today, and after the fall she hit her side and having severe pain on the left side of the lower chest. This might be likely rib fracture. It is not clearly visible on chest x-ray, PA and lateral, but as per ER physician, it is very likely, and there is local tenderness, so we will get a rib series to evaluate further. Pain management with morphine. She was taking chronic Norco tablets 10 mg every 6 hours at home because of arthritis. We will also need to get physical therapy evaluation because of her fall.  2.  Chronic cystitis. UA is positive, but as per her, she always has this pain without any problem or symptoms, so we will not give any antibiotics at this time  3.  Hypertension. She is taking Benazepril. We will continue the same.  4.  Coronary artery disease. The patient was not taking any medication at home, so we will not start on any new medication at this time   Code status is DNR, confirmed with the patient and her son in the room.   TOTAL TIME SPENT ON THIS ADMISSION: 45 minutes.    ____________________________ Hope Pigeon Elisabeth Pigeon, MD vgv:dmm D: 09/15/2013 21:58:30 ET T: 09/15/2013 22:21:56 ET JOB#: [id number]  cc: Hope Pigeon. Elisabeth Pigeon, MD, <Dictator> Altamese Dilling MD ELECTRONICALLY SIGNED 09/22/2013 23:05

## 2015-04-03 NOTE — Discharge Summary (Signed)
PATIENT NAME:  Gabriella Rosales, Gabriella Rosales MR#:  [id number] DATE OF BIRTH:  [birth year]  DATE OF ADMISSION:  12/27/2013 DATE OF DISCHARGE:  01/01/2014.   For a detailed note, please see the history and physical done on admission by Dr. Imogene Burnhen.   DIAGNOSES AT DISCHARGE:  As follows:  1.  Congestive heart failure, likely acute on chronic diastolic dysfunction secondary to mil volume overload. 2.  Acute gastroenteritis, likely viral, resolved.  3.  Chronic dysphagia. The patient refuses workup presently.  4.  Hyponatremia.  5.  Hyperkalemia.  6.  Chronic atrial fibrillation.   The patient is being discharged on a low-sodium, low-fat diet.   ACTIVITY:  As tolerated.   FOLLOWUP:  The patient is to follow up with her primary care physician at the skilled nursing facility, Dr. Maryellen PileEason.   DISCHARGE MEDICATIONS: 1.  Omeprazole 40 mg daily.  2.  MiraLAX daily as needed.  3.  Phenergan 25 mg daily as needed.  4.  Metoprolol tartrate 50 mg b.i.d. 5.  Tylenol with hydrocodone 10/325, 1 tab q.4 hours as needed.  6.  Aspirin 81 mg daily.   CONSULTANTS DURING HOSPITAL COURSE:  Dr. Lynnae Prudeobert Elliott from Gastroenterology.   PERTINENT STUDIES DONE DURING THE HOSPITAL COURSE:  Are as follows:  A CT scan of the abdomen and pelvis done with contrast on admission showing no acute focal lesion, moderate atherosclerosis, chronic colonic diverticulosis, scoliosis, T11 compression fracture. A chest x-ray showing multiple subacute left-sided rib fractures. Worsening compression of the lower thoracic vertebral body, likely T11. A chest x-ray again January 20 showing new blunting of the costophrenic angles with ill-defined opacity at the lung bases, favoring layering pleural effusions with passive atelectasis.   HOSPITAL COURSE:  This is a 81 year old female with medical problems as mentioned above, presented to the hospital on 12/27/2013, due to nausea and vomiting for 3 days and noted to have a viral gastritis and also noted to be  hypokalemic and hyperkalemic.   PROBLEM LIST: 1.  Nausea, vomiting, diarrhea. This was likely secondary to a viral gastroenteritis. It has since improved with supportive care with intravenous fluids, antiemetics. The patient currently is tolerating p.o. okay. The patient's CT abdomen and pelvis did not show any evidence of acute pathology presently.  2.  Hyponatremia. This was likely hypovolemic hyponatremia related to the nausea, vomiting and diarrhea. Since getting IV fluids, this has since then improved and resolved.  3.  Hyperkalemia. This was likely secondary to the mild acute renal failure and also concomitant use of ACE inhibitors. The patient was hydrated with IV fluids. Her ACE inhibitor was held, and her potassium has now normalized. She is currently being taken off her ACE inhibitor presently.  4.  Dysphagia. This is apparently a chronic problem for the patient. The patient was seen by Gastroenterology. The patient has a history of a stricture and dilatation in the past. At this point, the patient did not want any aggressive intervention therefore no EGD was planned. The patient also refused a swallow evaluation presently. The patient is currently tolerating p.o. okay with the mechanical soft diet, which she will continue for now.  5.  Leukocytosis. This was likely secondary to the gastroenteritis. It has since then improved and resolved.  6.  Congestive heart failure. This was likely acute on chronic congestive heart failure secondary to volume overload from the fluids she received. She was given p.r.n. IV Lasix and since then, it has improved and resolved. Now her oxygen saturation are stable on  room air.  7.  Chronic atrial fibrillation. The patient's rates are somewhat uncontrolled. She was started on low-dose beta blocker. I am titrating them up to 50 mg b.i.d. for now. I will start her on aspirin for anticoagulation as she is at high risk for long-term anticoagulation given her advanced age  and high fall risk.   DISPOSITION:  She is being discharged to a skilled nursing facility for ongoing rehab. Palliative Care had also been consulted in the hospital and the patient's pain is been well controlled with some p.r.n. Norco. The patient does have an overall poor prognosis. At this point, the plan is to discharge her to rehab with transition to Hospice if her clinical condition worsens.   CODE STATUS:  The patient is a DO NOT INTUBATE/DO NOT RESUSCITATE.   TIME SPENT ON DISCHARGE:  45 minutes.  ____________________________ Rolly Pancake. Cherlynn Kaiser, MD vjs:jm D: 01/01/2014 15:41:54 ET T: 01/01/2014 16:17:07 ET JOB#: [id number]  cc: Rolly Pancake. Cherlynn Kaiser, MD, <Dictator> Serita Sheller. Maryellen Pile, MD Houston Siren MD ELECTRONICALLY SIGNED 01/07/2014 11:29

## 2015-04-03 NOTE — Discharge Summary (Signed)
PATIENT NAME:  Gabriella Rosales, Gabriella Rosales MR#:  [id number] DATE OF BIRTH:  1916/03/10  DATE OF ADMISSION:  12/17/2013 DATE OF DISCHARGE:  12/20/2013  PRIMARY CARE PHYSICIAN:  Dr. Maryellen PileEason.  FINAL DIAGNOSES:  1.  Generalized weakness.  2.  Chronic pain with osteoarthritis.  3.  Malnutrition. 4.  A history of coronary artery disease.  5.  Hypertension. 6.  Constipation.   MEDICATIONS ON DISCHARGE:  1.  Omeprazole 40 mg daily. 2.  Benazepril 20 mg daily.  3.  Acetaminophen hydrocodone 10/325, 1 tablet every 4 hours standing dose. Needs to be woken up to give the medication.  4.  MiraLAX 17 grams once a day as needed for constipation.  5.  Ensure 3 times a day.   DIET:  Regular consistency.   ACTIVITY:  As tolerated with Physical Therapy.   FOLLOW UP:  One to 2 days with doctor at facility.   CONSULTATIONS DURING THE HOSPITAL COURSE:  1.  Dr. Harvie JuniorPhifer from Palliative Care. 2.  Physical Therapy.   HOSPITAL COURSE:  The patient was admitted 12/16/2013. The patient was admitted with loss of control of bladder and bowel and generalized weakness and dehydration.   LABORATORY AND RADIOLOGICAL DATA DURING THE HOSPITAL COURSE:  Included a urinalysis that was negative.   EKG:  Sinus rhythm, premature atrial complexes, left axis deviation, left bundle branch block. Glucose 121, BUN 16, creatinine 0.85, sodium 134, potassium 3.7, chloride 103, CO2 26, calcium 8.6. Liver function tests normal range. White blood cell count 11.0, H and H 12.6 and 39.4, platelet count of 245.   Chest x-ray:  Left fourth and seventh rib fractures, new but appear subacute. No acute cardiopulmonary disease.   A repeat white blood cell count and chemistry:  White blood cell count 8.5, hemoglobin 12, hematocrit 37.1, platelet count of 209, glucose 89, BUN 12, creatinine 0.77, sodium 140, potassium 3.9, chloride 108, CO2 of 28.   HOSPITAL COURSE PER PROBLEM LIST:  1.  For the patient's weakness, [facility] will be [facility].   2.  For osteoarthritis and chronic pain, she is on standing dose Norco.  3.  For a history of coronary artery disease, she is not on any medication for this.  4.  For her history of hypertension, she is on Benazepril.  5.  For her constipation, she is on MiraLAX p.r.n. 6.  For her malnutrition, she is on Ensure.   CODE STATUS:  The patient is a DO NOT RESUSCITATE.   TIME SPENT ON DISCHARGE:  35 minutes.   ____________________________ Herschell Dimesichard J. Renae GlossWieting, MD rjw:jm D: 12/20/2013 09:37:28 ET T: 12/20/2013 09:52:31 ET JOB#: [id number]  cc: Herschell Dimesichard J. Renae GlossWieting, MD, <Dictator> Serita ShellerErnest B. Maryellen PileEason, MD Salley ScarletICHARD J Angelena Sand MD ELECTRONICALLY SIGNED 12/22/2013 15:35

## 2015-04-03 NOTE — Consult Note (Signed)
PATIENT NAME:  Gabriella Rosales, [name] MR#:  [id number] DATE OF BIRTH:  Apr 08, 1916  DATE OF CONSULTATION:  12/29/2013  REFERRING PHYSICIAN:  Katharina Caperima Vaickute, MD CONSULTING PHYSICIAN:  Ranae PlumberKimberly A. Arvilla MarketMills, ANP (Adult Nurse Practitioner)/Robert Daron Offer. Elliott, MD  PRIMARY CARE PHYSICIAN:  Serita Shellerrnest B. Maryellen PileEason, MD  REASON FOR CONSULTATION:  Dysphagia.  HISTORY OF PRESENT ILLNESS:  This is a 81 year old patient with a past medical history of gastroesophageal reflux disease, Schatzki's ring with previous dilatation, hiatal hernia, CAD, peptic ulcer disease and chronic cystitis who was admitted to the hospital for a 1-week history of nausea, vomiting and weakness.  She was hospitalized 12/16/2013 for generalized weakness, dehydration, functional decline and falls.  She was discharged on 12/20/2013 back to skilled nursing to increase her strength.    The patient reports she felt fine until last week, when she developed sickness with nausea, vomiting and constipation.  She was not able to eat well for about 3 or 4 days and she presented back to the hospital on 12/27/2013 for admission.  The etiology of her nausea and vomiting was thought to be due to either gastroenteritis, possible food poisoning or acid reflux esophagitis.  She had electrolyte imbalance that has been corrected with IV fluid management.  Her TSH is low at 0.410 and her hemoglobin is 10.9, down from upper 12 range from last week.  The patient has had resolution of her nausea and vomiting but is experiencing a sensation in her esophagus that it feels swollen to her.    The patient says when she swallows there is no pain or sense of food hanging like she has experienced in the past when she realizes her Schatzki's ring needs stretched.  The patient reports that this feels like just an overall esophageal irritation.  She was able to tolerate a full liquid diet today for the first time.  Stomach feels settled, no nausea, vomiting or diarrhea.   The patient did undergo  a CT of the abdomen and pelvis with contrast on 12/27/2013 for investigation of the vomiting and it revealed no acute or focal lesions to explain her symptoms.  Moderate atherosclerotic disease without evidence of aneurysm.  Colonic diverticulosis without diverticulitis.  There were spine changes, scoliosis in her lumbar spine and T11 compression fracture.  Her most recent upper endoscopy performed by Dr. Mechele CollinElliott 09/01/2011 for dysphagia revealed a moderate Schatzki's ring (acquired) in the gastroesophageal junction.  This was dilated.  There was esophageal motility disorder as well represented distal esophagus as well as spasticity but it would allow the scope to pass.  There was normal stomach and normal duodenum.  The patient reports a very distant history of barium swallow study.   GI  has been asked to see her regarding her complaints of swallowing.  Her husband points out that she has had slight complaints of difficulty swallowing but the patient says that it is not typical of her Schatzki's ring symptoms.    PAST MEDICAL HISTORY:   1.  GERD. 2.  Arthritis. 3.  Esophageal stricture with Schatzki's ring, previous dilatation, most recent EGD 2012. 4.  CAD. 5.  Hiatal hernia with reflux esophagitis. 6.  Diverticulosis.   7.  PUD with gastric ulcer.  8.  Urinary tract infection. 9.  Interstitial cystitis.    PAST SURGICAL HISTORY:  Hysterectomy.  SOCIAL HISTORY:  Negative tobacco or alcohol past or present.    FAMILY HISTORY:  Positive for CVA.  REVIEW OF SYSTEMS:  The patient says she is feeling  better.  Feels weak.  Feels like there is some swelling or inflammation in the esophagus when she swallows.  She denies pain.  She notes no hematemesis with vomiting.  She had a lot of vomiting reported last week.  Denies any abdominal pain.  Denies chest pain or shortness of breath.  Remaining 10 systems otherwise negative.    PHYSICAL EXAMINATION: VITAL SIGNS:  Temperature 98.4, 113, 118,  141/63, pulse oxygen room air is 95%. GENERAL:  A frail, elderly Caucasian female resting in bed visiting with family.  Mildly hard of hearing.   HEENT:  Shows head is normocephalic, positive blepharitis noted bilaterally.  No eye drainage.  Sclerae is anicteric.  Oral mucosa is moist, intact. NECK:  Supple, trachea midline. HEART TONES:  S1 and S2, regular now but irregular history with atrial fibrillation noted in chart.  Mild tachycardia. LUNGS:  CTA.  Respirations are eupneic.   ABDOMEN:  Soft, nontender in all quadrants. RECTAL:  Exam was deferred. SKIN:  Warm and dry without rash.   LOWER EXTREMITIES:  Without edema.   PSYCHIATRIC:  The patient is alert, oriented, very active and participating with her care.  Good historian.   NEURO:  Moving extremities x 4.  Gait not evaluated.   MUSCULOSKELETAL:  No obvious joint swelling or inflammation.    LABORATORY, DIAGNOSTIC AND RADIOLOGICAL DATA:  Admission labs with BUN 63, creatinine 1.0, sodium 130, potassium 5.9, CO2 20, magnesium 2.8, albumin 3.3.  WBC was 18.9, hemoglobin 13.  Urine was yellow, cloudy, 3 WBC per high-powered field, positive mucus present.    Repeated laboratory studies dated 12/28/2013 with BUN 39, creatinine 0.91, potassium 3.8, chloride 112, CO2 23, calcium 7.8, magnesium 2.4.  WBC 9.6, hemoglobin 10.9.  Free T3 is normal 2.3, TSH was 0.410.  Repeat hemoglobin today 10.4.    IMPRESSION:  The patient came in with nausea, vomiting, possible diarrhea, dehydration and electrolyte imbalance, now improved.  She is tolerating a full liquid diet with pudding, swallowing well with that.  She does feel like there is some swelling or inflammation in her entire esophagus.  She denies antibiotic therapy.  She did have repetitive vomiting reported and denies passage of blood.  The patient does have history of Schatzki's ring at the gastroesophageal junction, most recently dilated per esophagogastroduodenoscopy in 2012.  This patient  refuses to have upper endoscopy.  She declines barium swallow.    PLAN:  We will treat empirically with nystatin swish-and-swallow, increase Protonix, make sure she is well covered for possible esophagitis, reflux-associated esophagitis versus candidiasis.  Will discuss this case with Dr. Mechele Collin for further  recommendations.  The patient will continue on her full liquids and advance as tolerated with close monitoring.    These services provided by Cala Bradford A. Arvilla Market, MS, APRN, BC, ANP (Adult Nurse Practitioner) under collaborative agreement with Scot Jun, MD.    ____________________________ Ranae Plumber Arvilla Market, ANP (Adult Nurse Practitioner) kam:cs D: 12/29/2013 16:12:19 ET T: 12/29/2013 18:32:32 ET JOB#: [id number]  cc: Cala Bradford A. Arvilla Market, ANP (Adult Nurse Practitioner), <Dictator> Ranae Plumber Suzette Battiest, MSN, ANP-BC Adult Nurse Practitioner ELECTRONICALLY SIGNED 01/02/2014 13:42

## 2015-04-03 NOTE — H&P (Signed)
PATIENT NAME:  Gabriella Rosales, Gabriella Rosales MR#:  [id number] DATE OF BIRTH:  03-05-1916  DATE OF ADMISSION:  12/27/2013  PRIMARY CARE PHYSICIAN: Alden Server B. Maryellen Pile, MD  REFERRING PHYSICIAN: Charlestine Night. Scotty Court, MD  CHIEF COMPLAINT: Nausea and vomiting for 3 days.   HISTORY OF PRESENT ILLNESS: This is a 81 year old Caucasian female with a history of GERD, CAD, was sent from nursing home due to nausea and vomiting for 3 days. The patient was just discharged from our hospital 1 week ago. The patient was fine until 3 days ago. The patient developed nausea and vomiting multiple times a day. The patient has poor oral intake,  feels weak and dizzy, so the patient was sent to ED for further evaluation. The patient was noted to have BUN at 63, potassium 5.9. The patient was treated with normal saline in ED. The patient denies any chest pain, but feels reflux from the stomach to the throat. She denies any fever, chills. No palpitations, orthopnea or nocturnal dyspnea. No melena or bloody stool. The patient is almost bedbound, sits in wheelchair in nursing home.   PAST MEDICAL HISTORY:  1.  GERD.  2.  Arthritis. 3.  Esophageal stricture with a history of dilation. 4.  CAD.  5.  Hiatal hernia with reflux esophagitis.  6.  Diverticular disease.  7.  PUD with gastric ulcer.  8.  Chronic urinary tract infection with cystitis and multiple courses of antibiotics.   PAST SURGICAL HISTORY: Hysterectomy.   SOCIAL HISTORY: No smoking or drinking or illicit drugs. Nursing home resident.   FAMILY HISTORY: Positive for CVA in many family members.   ALLERGIES: LEXAPRO, MORPHINE, PERCOCET, SULFA DRUGS, FENTANYL.  HOME MEDICATIONS:  1.  Polyethylene glycol 3350, 17 grams p.o. once a day p.r.n. for constipation.  2.  Omeprazole 40 mg p.o. daily.  3.  Benazepril 20 mg p.o. daily.  4.  Percocet 325/10 mg p.o. daily.  5.  Acetaminophen/hydrocodone 325 mg/10 mg p.o. tablets every 4 hours p.r.n.   REVIEW OF SYSTEMS:     CONSTITUTIONAL: The patient denies any fever or chills, but has headache, dizziness, weakness, and poor oral intake.  EYES: No double vision or blurry vision.  EARS, NOSE, THROAT: No postnasal drip, slurred speech or dysphagia.  CARDIOVASCULAR: No chest pain, palpitations, orthopnea or nocturnal dyspnea. No leg edema.  PULMONARY: No cough, sputum, shortness of breath or hemoptysis.  GASTROINTESTINAL: No abdominal pain, but has nausea, vomiting, and acid reflux. No melena or bloody stool.  GENITOURINARY: No dysuria, hematuria or incontinence.  SKIN: No rash or jaundice.  NEUROLOGIC: No syncope, loss of consciousness or seizure.  ENDOCRINE: No polyuria, polydipsia, heat or cold intolerance.  HEMATOLOGIC: No easy bruising or bleeding.   PHYSICAL EXAMINATION: VITAL SIGNS: Temperature 97.6, blood pressure 105/70, pulse 102, oxygen saturation 98% on room air.  GENERAL: The patient is alert, awake, oriented, in no acute distress. Looks lethargic.  HEENT: Pupils round, equal, reactive to light and accommodation. Very dry oral mucosa. Clear oropharynx.  NECK: Supple. No JVD or carotid bruit. No lymphadenopathy. No thyromegaly.  CARDIOVASCULAR: S1, S2, regular rate and rhythm. No murmurs or gallops.  PULMONARY: Bilateral air entry. No wheezing or rales. No use of accessory muscles to breathe.  ABDOMEN: Soft. No distention or tenderness. No organomegaly. Bowel sounds present.  EXTREMITIES: No edema, clubbing or cyanosis. No calf tenderness. Bilateral pedal pulses present.  SKIN: No rash or jaundice, but has poor turgor.  NEUROLOGIC: A and O x 3. No focal deficits. Power  2 to 3 out of 5 in bilateral lower extremities. Sensation intact.   LABORATORY, DIAGNOSTIC AND RADIOLOGICAL DATA: CAT scan of abdomen and pelvis showed no acute or focal lesion; colonic diverticulosis without evidence of diverticulitis; T11 compression fracture, appears not to be acute, but is new since 2012.   Chest x-ray: Multiple  subacute left-sided rib fracture re-demonstrated, worsening compression fractures of lower thoracic vertebral body, likely T11.   Urinalysis negative. Troponin 0.05. WBC 18.9, hemoglobin 13, platelets 286. Glucose 83, BUN 63, creatinine 1.0, sodium 130, potassium 5.9, chloride 101, bicarbonate 20, lipase 162.   EKG showed A. fib with RVR at 109, with left bundle branch block with T wave.   IMPRESSIONS:  1.  Nausea, vomiting, possibly due to acid reflux or reflux esophagitis.  2.  Hyperkalemia.  3.  Dehydration.  4.  Hyponatremia.  5.  Leukocytosis.  6.  Atrial fibrillation.  7.  Coronary artery disease.  8.  Hiatal hernia with reflux esophagitis.  9.  History of diverticular disease, peptic ulcer disease, and gastric ulcer.   PLAN OF TREATMENT: 1.  The patient will be admitted to the medical floor. We will start telemonitor. Hold benazepril. Start D50 one amp with NovoLog 10 units IV.  2.  Will give Kayexalate and follow up potassium level.  3.  For hyponatremia with dehydration, we will continue normal saline IV and follow up her BMP.  4.  Leukocytosis is possibly due to reaction. We will follow up CBC.  5.  The patient was started with a Protonix drip. Continue Protonix drip.  6.  For A. fib, we will not give any anticoagulation due to the patient's age and high risk of fall and bleeding.  7.  I discussed the patient's condition and plan of treatment with the patient's granddaughter. The patient's code status is DO NOT RESUSCITATE.   TIME SPENT: About 65 minutes.    ____________________________ Shaune PollackQing Blaike Newburn, MD qc:jcm D: 12/27/2013 17:33:43 ET T: 12/27/2013 19:19:36 ET JOB#: [id number]  cc: Shaune PollackQing Ameena Vesey, MD, <Dictator> Shaune PollackQING Lenyx Boody MD ELECTRONICALLY SIGNED 12/28/2013 10:56

## 2015-04-03 NOTE — Consult Note (Signed)
See consult by NP Fransico SettersKim Mills.  Pt with nausea and vomiting with dehydration, throat pain, previous hx of esophageal stricture with dilatation few years ago.  Now feeling a little better, able to eat half a hamburger, consume chicken noodle soup.  Abd not tender now. no vomiting or diarrhea today.  Most likely had viral gastroenteritis and now is improving, would continue PPI and nystatin after discharge.  No plans for EGD, etc.  Electronic Signatures: Scot JunElliott, Sarahgrace Broman T (MD)  (Signed on 19-Jan-15 18:47)  Authored  Last Updated: 19-Jan-15 18:47 by Scot JunElliott, Lanelle Lindo T (MD)

## 2015-04-03 NOTE — Consult Note (Signed)
Pt without diarrhea today. abd not tender.CXR noted pleural fluid.  No new GI problems.pulse rate up to 120, sat as low as 87%, better on oxygen.  Electronic Signatures: Scot JunElliott, Robert T (MD)  (Signed on 20-Jan-15 18:17)  Authored  Last Updated: 20-Jan-15 18:17 by Scot JunElliott, Robert T (MD)

## 2015-04-03 NOTE — H&P (Signed)
PATIENT NAME:  Gabriella Rosales, Gabriella L MR#:  Rosales DATE OF BIRTH:  [birth year]  DATE OF ADMISSION:  12/16/2013  PRIMARY CARE PHYSICIAN: Toy CookeyErnest Eason, MD  REFERRING EMERGENCY ROOM PHYSICIAN: Minna AntisKevin Paduchowski, MD  CHIEF COMPLAINT: Loss of control of bladder and bowel.   HISTORY OF PRESENTING ILLNESS: A 81 year old female, with multiple past medical history including arthritis, gastroesophageal reflux disease, esophageal stricture and dilation in the past, coronary artery disease, hiatal hernia and reflux esophagitis, diverticular disease, peptic ulcer disease, chronic urinary tract infection with chronic cystitis, was living at home but gradually getting worse for the last 1 week. She has worsening in her overall condition and she is not able to stand up or do her day-to-day activities. Felt very weak for the last 2 days, and since yesterday she has lost control of her bowel and bladder.   Her son is present in the room, and the patient is also mentally very sharp. Denies any complaint of fever or focal weakness, any headache or abnormal movements. Hospitalist consult is contacted for further management and overall because of functional decline.   REVIEW OF SYSTEMS: CONSTITUTIONAL: Negative for fever, fatigue, but positive for generalized weakness. No pain or weight loss.  EYES: No blurring, double vision, discharge or redness.  EARS, NOSE, THROAT: No tinnitus, ear pain or hearing loss.  RESPIRATORY: No cough, wheezing, hemoptysis, or shortness of breath.  CARDIOVASCULAR: No chest pain, orthopnea, edema or palpitations.  GASTROINTESTINAL: No nausea, vomiting, diarrhea, or abdominal pain.  GENITOURINARY: No dysuria, hematuria or increased frequency.  ENDOCRINE: No increased sweating. No heat or cold intolerance.  SKIN: No acne, rashes, or lesions.  MUSCULOSKELETAL: No pain or swelling in the joints.  NEUROLOGICAL: No numbness, weakness, tremor, or vertigo but has generalized weakness.  PSYCHIATRIC:  No anxiety, insomnia, bipolar disorder.   PAST MEDICAL HISTORY:  1.  Arthritis, on chronic narcotics.  2.  Gastroesophageal reflux disease.  3.  History of dilation and stricture secondary to esophageal stricture.  4.  Coronary artery disease.  5.  Hiatal hernia with reflux esophagitis.  6.  History of diverticular disease.  7.  Peptic ulcer disease with gastric ulcer.  8.  Chronic urinary tract infection with cystitis and multiple courses of antibiotics and followup with multiple specialists for that.   PAST SURGICAL HISTORY: Hysterectomy in 1965.   SOCIAL HISTORY: No tobacco use. No alcohol and no drug use.   FAMILY HISTORY: Positive for stroke in many family members.   MEDICATIONS:  1.  Omeprazole 40 mg once a day.  2.  Benazepril 20 mg once a day.  3.  Acetaminophen and hydrocodone 325/10 mg every 4 hours as needed for pain.   PHYSICAL EXAMINATION:  VITAL SIGNS: In ER, 97.6, pulse rate 77, respirations 18, blood pressure 149/80, pulse of 94% at rest.  GENERAL: Fully alert and oriented to time, place, and person. Appears slightly distressed due to generalized weakness.  HEENT: Head and neck atraumatic. Conjunctivae pink. Oral mucosa moist.  NECK: Supple. No JVD.  RESPIRATORY: Bilateral clear and equal air entry.  CARDIOVASCULAR: S1, S2 present, regular. No murmur.  ABDOMEN: Soft, nontender. Bowel sounds present. No organomegaly.  SKIN: No rashes. Legs: No edema.  NEUROLOGICAL: Power 4/5. Generalized weakness. No tremors or no abnormality.  PSYCHIATRIC: Does not appear in any acute psychiatric illness at this time.   IMPORTANT LABORATORY RESULTS: Glucose 121, BUN 16, creatinine 0.85, sodium 134, potassium 3.7, chloride 103, CO2 26, anion gap 5, calcium 8.6, total protein 6.9, albumin 3.4,  bilirubin 0.4, alkaline phosphate 82, SGOT 22 and SGPT 14. WBC 11, hemoglobin 12.6, platelet count 242. MCV is 92. Urinalysis is grossly negative.   Chest x-ray is not done yet.   ASSESSMENT  AND PLAN: A 81 year old female who has gradual worsening over the last 1 week in overall health and started having urine and stool incontinence, not able to get out of bed now on her own. So far there is no source of infection.  1.  Generalized weakness, old age, and dehydration with functional decline. We will admit her for IV fluid and physical therapy evaluation. Overall prognosis is very poor, and the patient understands that and agreed, and she said that she would like to be just comfortable at this time of her life, so I will call palliative care consult for that.  2.  Chronic pain. We will continue Dilaudid and her home medication, oxycodone, that she was taking.  3.  Gastroesophageal reflux disease. We will continue pantoprazole daily.   CODE STATUS: DNR.   TOTAL TIME SPENT ON THIS ADMISSION: 50 minutes.     ____________________________ Hope Pigeon Elisabeth Pigeon, MD vgv:np D: 12/16/2013 20:32:38 ET T: 12/16/2013 21:05:00 ET JOB#: [id number]  cc: Hope Pigeon. Elisabeth Pigeon, MD, <Dictator> Serita Sheller. Maryellen Pile, MD Altamese Dilling MD ELECTRONICALLY SIGNED 12/17/2013 22:24

## 2015-04-03 NOTE — Consult Note (Signed)
Nurse reports no diarrhea during the night.  I will sign off, reconsult if needed.  Electronic Signatures: Scot JunElliott, Robert T (MD)  (Signed on 21-Jan-15 13:46)  Authored  Last Updated: 21-Jan-15 13:46 by Scot JunElliott, Robert T (MD)

## 2015-04-03 NOTE — Consult Note (Signed)
   Comments   I met with pt and her son, Shanon Brow. We discussed the options for pt at discharge. Pt says that she cannot return home without 24hr care and is unable to pay for private sitters. She is willing to go to SNF for STR.  discussed pain medication with pt. She takes hydrocodone $RemoveBeforeDEI'10mg'[id number]$  q 4hrs at home. She knows that she has also been getting hydromorphone IV in hospital but says it doesn't really control her pain. Will plan to d/c on hydrocodone only.    Electronic Signatures: Zebastian Carico, Izora Gala (MD)  (Signed 07-Jan-15 15:18)  Authored: Palliative Care   Last Updated: 07-Jan-15 15:18 by Samaira Holzworth, Izora Gala (MD)
